# Patient Record
Sex: Male | Born: 2000 | Race: Black or African American | Hispanic: No | Marital: Single | Smoking: Never smoker
Health system: Southern US, Community
[De-identification: ages and names within clinical notes are randomized; demographics above are authoritative.]

## PROBLEM LIST (undated history)

## (undated) DIAGNOSIS — H209 Unspecified iridocyclitis: Secondary | ICD-10-CM

## (undated) DIAGNOSIS — D1621 Benign neoplasm of long bones of right lower limb: Secondary | ICD-10-CM

## (undated) DIAGNOSIS — Z9889 Other specified postprocedural states: Secondary | ICD-10-CM

## (undated) DIAGNOSIS — I861 Scrotal varices: Secondary | ICD-10-CM

## (undated) HISTORY — DX: Scrotal varices: I86.1

## (undated) HISTORY — PX: EYE SURGERY: SHX253

## (undated) HISTORY — DX: Benign neoplasm of long bones of right lower limb: D16.21

## (undated) HISTORY — DX: Unspecified iridocyclitis: H20.9

## (undated) HISTORY — PX: FETAL SURGERY FOR CONGENITAL HERNIA: SHX1618

## (undated) HISTORY — DX: Other specified postprocedural states: Z98.890

## (undated) HISTORY — PX: RUPTURED GLOBE EXPLORATION AND REPAIR: SHX2366

---
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
## 2022-02-16 DIAGNOSIS — S91301A Unspecified open wound, right foot, initial encounter: Secondary | ICD-10-CM | POA: Diagnosis not present

## 2022-02-16 DIAGNOSIS — W450XXA Nail entering through skin, initial encounter: Secondary | ICD-10-CM | POA: Diagnosis not present

## 2023-01-11 DIAGNOSIS — R519 Headache, unspecified: Secondary | ICD-10-CM | POA: Diagnosis not present

## 2023-01-22 ENCOUNTER — Encounter (HOSPITAL_BASED_OUTPATIENT_CLINIC_OR_DEPARTMENT_OTHER): Payer: Self-pay | Admitting: Urology

## 2023-01-22 ENCOUNTER — Emergency Department (HOSPITAL_BASED_OUTPATIENT_CLINIC_OR_DEPARTMENT_OTHER)
Admission: EM | Admit: 2023-01-22 | Discharge: 2023-01-22 | Disposition: A | Discharge status: Home / Self Care | Payer: BC Managed Care – PPO | Attending: Emergency Medicine | Admitting: Emergency Medicine

## 2023-01-22 ENCOUNTER — Other Ambulatory Visit: Payer: Self-pay

## 2023-01-22 DIAGNOSIS — H5711 Ocular pain, right eye: Secondary | ICD-10-CM | POA: Diagnosis not present

## 2023-01-22 MED ORDER — KETOROLAC TROMETHAMINE 60 MG/2ML IM SOLN
15.0000 mg | Freq: Once | INTRAMUSCULAR | Status: AC
  Administered 2023-01-22: 15 mg via INTRAMUSCULAR
  Filled 2023-01-22: qty 2

## 2023-01-22 MED ORDER — ERYTHROMYCIN 5 MG/GM OP OINT
TOPICAL_OINTMENT | Freq: Once | OPHTHALMIC | Status: AC
  Administered 2023-01-22: 1 via OPHTHALMIC
  Filled 2023-01-22: qty 3.5

## 2023-01-22 MED ORDER — TETRACAINE HCL 0.5 % OP SOLN
2.0000 [drp] | Freq: Once | OPHTHALMIC | Status: AC
  Administered 2023-01-22: 2 [drp] via OPHTHALMIC
  Filled 2023-01-22: qty 4

## 2023-01-22 MED ORDER — OXYCODONE HCL 5 MG PO TABS
5.0000 mg | ORAL_TABLET | ORAL | Status: AC
  Administered 2023-01-22: 5 mg via ORAL
  Filled 2023-01-22: qty 1

## 2023-01-22 MED ORDER — FLUORESCEIN SODIUM 1 MG OP STRP
1.0000 | ORAL_STRIP | Freq: Once | OPHTHALMIC | Status: AC
  Administered 2023-01-22: 1 via OPHTHALMIC
  Filled 2023-01-22: qty 1

## 2023-01-22 MED ORDER — OXYCODONE HCL 5 MG PO TABS: 5.0000 mg | ORAL_TABLET | ORAL | 0 refills | Status: DC | PRN

## 2023-01-22 NOTE — ED Triage Notes (Signed)
Pt states right eye pain and redness that started Friday  Denies any known injury  No drainage

## 2023-01-22 NOTE — ED Notes (Signed)
Pt A&OX4 ambulatory at d/c with independent steady gait. Pt and mother verbalized understanding of d/c instructions, prescription and follow up care ?

## 2023-01-22 NOTE — ED Provider Notes (Signed)
  [place] [facility] Provider Note   CSN: [id number] Arrival date & time: 01/22/23  1652     History {Add pertinent medical, surgical, social history, OB history to HPI:1} Chief Complaint  Patient presents with   Eye Pain    Jesus Massey is a 22 y.o. male.   Eye Pain       Home Medications Prior to Admission medications   Medication Sig Start Date End Date Taking? Authorizing Provider  naproxen (NAPROSYN) 375 MG tablet Take 1 tablet twice daily as needed for back pain. 01/11/19   Molpus, John, MD  omeprazole (PRILOSEC) 40 MG capsule Take 40 mg by mouth daily.    [provider]      Allergies    Patient has no known allergies.    Review of Systems   Review of Systems  Eyes:  Positive for pain.    Physical Exam Updated Vital Signs BP 136/80 (BP Location: Left Arm)   Pulse 73   Temp 98.2 F (36.8 C)   Resp 18   Ht 5\' 10"  (1.778 m)   Wt 70.3 kg   SpO2 96%   BMI 22.24 kg/m  Physical Exam  ED Results / Procedures / Treatments   Labs (all labs ordered are listed, but only abnormal results are displayed) Labs Reviewed - No data to display  EKG None  Radiology No results found.  Procedures Procedures  {Document cardiac monitor, telemetry assessment procedure when appropriate:1}  Medications Ordered in ED Medications - No data to display  ED Course/ Medical Decision Making/ A&P   {   Click here for ABCD2, HEART and other calculatorsREFRESH Note before signing :1}                          Medical Decision Making  ***  {Document critical care time when appropriate:1} {Document review of labs and clinical decision tools ie heart score, Chads2Vasc2 etc:1}  {Document your independent review of radiology images, and any outside records:1} {Document your discussion with family members, caretakers, and with consultants:1} {Document social determinants of health affecting pt's care:1} {Document your  decision making why or why not admission, treatments were needed:1} Final Clinical Impression(s) / ED Diagnoses Final diagnoses:  None    Rx / DC Orders ED Discharge Orders     None

## 2023-01-22 NOTE — ED Notes (Signed)
Visual acuity completed per order. Pt wears glasses and reports blurriness in right eye.

## 2023-01-22 NOTE — Discharge Instructions (Addendum)
You were seen in the ER today for evaluation of your right eye pain. I am unsure of what is causing this pain. I have spoken to a physician on call at your eye doctor. They will call you to schedule an appointment for tomorrow. Please make sure you answer your phone. If you haven't heard from them by the afternoon, please call. Please take 1000mg  of Tylenol and 600mg  of Ibuprofen every 6 hours as needed for pain. He already received a dose of ibuprofen here. I will send home a few narcotic pain pills as well. Please do not drive or operate heavy machinery while on this medication. Take only for breakthrough pain. Additionally, you can use the erythromycin ointment every few hours while awake to help with symptoms as well. Please do not rub your eye. If you have any concerns, new or worsening symptoms, please return to the nearest ER for re-evaluation.   Contact a doctor if: You keep having eye pain and other symptoms for more than 2 days. You get new symptoms, such as more redness, watery eyes, or discharge. You have discharge that makes your eyelids stick together in the morning. Your eye patch becomes so loose that you can blink your eye. Symptoms come back after your eye heals. Get help right away if: You have very bad eye pain that does not get better with medicine. You lose eyesight.

## 2023-01-23 DIAGNOSIS — H209 Unspecified iridocyclitis: Secondary | ICD-10-CM | POA: Diagnosis not present

## 2023-01-23 DIAGNOSIS — Z9889 Other specified postprocedural states: Secondary | ICD-10-CM | POA: Diagnosis not present

## 2023-01-30 DIAGNOSIS — H52211 Irregular astigmatism, right eye: Secondary | ICD-10-CM | POA: Diagnosis not present

## 2023-01-30 DIAGNOSIS — Z9889 Other specified postprocedural states: Secondary | ICD-10-CM | POA: Diagnosis not present

## 2023-01-30 DIAGNOSIS — H209 Unspecified iridocyclitis: Secondary | ICD-10-CM | POA: Diagnosis not present

## 2023-01-30 DIAGNOSIS — H179 Unspecified corneal scar and opacity: Secondary | ICD-10-CM | POA: Diagnosis not present

## 2023-02-13 DIAGNOSIS — H5213 Myopia, bilateral: Secondary | ICD-10-CM | POA: Diagnosis not present

## 2023-02-13 DIAGNOSIS — H2011 Chronic iridocyclitis, right eye: Secondary | ICD-10-CM | POA: Diagnosis not present

## 2023-02-13 DIAGNOSIS — Z91148 Patient's other noncompliance with medication regimen for other reason: Secondary | ICD-10-CM | POA: Diagnosis not present

## 2023-02-13 DIAGNOSIS — Z9889 Other specified postprocedural states: Secondary | ICD-10-CM | POA: Diagnosis not present

## 2023-02-13 DIAGNOSIS — H209 Unspecified iridocyclitis: Secondary | ICD-10-CM | POA: Diagnosis not present

## 2023-02-13 DIAGNOSIS — H52203 Unspecified astigmatism, bilateral: Secondary | ICD-10-CM | POA: Diagnosis not present

## 2023-02-13 DIAGNOSIS — H21561 Pupillary abnormality, right eye: Secondary | ICD-10-CM | POA: Diagnosis not present

## 2023-03-29 ENCOUNTER — Emergency Department (HOSPITAL_BASED_OUTPATIENT_CLINIC_OR_DEPARTMENT_OTHER)
Admission: EM | Admit: 2023-03-29 | Discharge: 2023-03-29 | Disposition: A | Discharge status: Home / Self Care | Payer: BC Managed Care – PPO | Attending: Emergency Medicine | Admitting: Emergency Medicine

## 2023-03-29 ENCOUNTER — Encounter (HOSPITAL_BASED_OUTPATIENT_CLINIC_OR_DEPARTMENT_OTHER): Payer: Self-pay | Admitting: Emergency Medicine

## 2023-03-29 ENCOUNTER — Other Ambulatory Visit: Payer: Self-pay

## 2023-03-29 DIAGNOSIS — R519 Headache, unspecified: Secondary | ICD-10-CM | POA: Diagnosis not present

## 2023-03-29 DIAGNOSIS — R42 Dizziness and giddiness: Secondary | ICD-10-CM | POA: Insufficient documentation

## 2023-03-29 LAB — BASIC METABOLIC PANEL
Anion gap: 10 (ref 5–15)
BUN: 15 mg/dL (ref 6–20)
CO2: 24 mmol/L (ref 22–32)
Calcium: 9.2 mg/dL (ref 8.9–10.3)
Chloride: 103 mmol/L (ref 98–111)
Creatinine, Ser: 0.92 mg/dL (ref 0.61–1.24)
GFR, Estimated: >60 mL/min (ref >60)
Glucose, Bld: 101 mg/dL — ABNORMAL HIGH (ref 70–99)
Potassium: 3.9 mmol/L (ref 3.5–5.1)
Sodium: 137 mmol/L (ref 135–145)

## 2023-03-29 LAB — CBC
HCT: 44.5 % (ref 39.0–52.0)
Hemoglobin: 14.4 g/dL (ref 13.0–17.0)
MCH: 27.6 pg (ref 26.0–34.0)
MCHC: 32.4 g/dL (ref 30.0–36.0)
MCV: 85.2 fL (ref 80.0–100.0)
Platelets: 163 10*3/uL (ref 150–400)
RBC: 5.22 MIL/uL (ref 4.22–5.81)
RDW: 14.1 % (ref 11.5–15.5)
WBC: 3.9 10*3/uL — ABNORMAL LOW (ref 4.0–10.5)
nRBC: 0 % (ref 0.0–0.2)

## 2023-03-29 LAB — CBG MONITORING, ED: Glucose-Capillary: 92 mg/dL (ref 70–99)

## 2023-03-29 LAB — URINALYSIS, ROUTINE W REFLEX MICROSCOPIC
Bilirubin Urine: NEGATIVE
Glucose, UA: NEGATIVE mg/dL
Hgb urine dipstick: NEGATIVE
Ketones, ur: NEGATIVE mg/dL
Leukocytes,Ua: NEGATIVE
Nitrite: NEGATIVE
Protein, ur: NEGATIVE mg/dL
Specific Gravity, Urine: 1.02 (ref 1.005–1.030)
pH: 7 (ref 5.0–8.0)

## 2023-03-29 MED ORDER — PROCHLORPERAZINE EDISYLATE 10 MG/2ML IJ SOLN
10.0000 mg | Freq: Once | INTRAMUSCULAR | Status: AC
  Administered 2023-03-29: 10 mg via INTRAVENOUS
  Filled 2023-03-29: qty 2

## 2023-03-29 MED ORDER — KETOROLAC TROMETHAMINE 15 MG/ML IJ SOLN
15.0000 mg | Freq: Once | INTRAMUSCULAR | Status: AC
  Administered 2023-03-29: 15 mg via INTRAVENOUS
  Filled 2023-03-29: qty 1

## 2023-03-29 MED ORDER — DIPHENHYDRAMINE HCL 50 MG/ML IJ SOLN
12.5000 mg | Freq: Once | INTRAMUSCULAR | Status: AC
  Administered 2023-03-29: 12.5 mg via INTRAVENOUS
  Filled 2023-03-29: qty 1

## 2023-03-29 MED ORDER — MECLIZINE HCL 12.5 MG PO TABS: 25.0000 mg | ORAL_TABLET | Freq: Three times a day (TID) | ORAL | 0 refills | Status: AC | PRN

## 2023-03-29 MED ORDER — MAGNESIUM SULFATE 2 GM/50ML IV SOLN
2.0000 g | Freq: Once | INTRAVENOUS | Status: AC
  Administered 2023-03-29: 2 g via INTRAVENOUS
  Filled 2023-03-29: qty 50

## 2023-03-29 MED ORDER — SODIUM CHLORIDE 0.9 % IV BOLUS
1000.0000 mL | Freq: Once | INTRAVENOUS | Status: AC
Start: 2023-03-29 — End: 2023-03-29
  Administered 2023-03-29: 1000 mL via INTRAVENOUS

## 2023-03-29 NOTE — ED Provider Notes (Signed)
[facility] Provider Note   CSN: [id number] Arrival date & time: 03/29/23  1720     History  Chief Complaint  Patient presents with   Dizziness    Jesus Massey is a 22 y.o. male with no significant past medical history presents the ED today for dizziness.  Patient reports he has been experiencing intermittent dizziness for the past 4 days with a persistent headache.  The dizziness is worse when moving his head.  Patient reports a history of dizziness in the past but it was self-limited. Denies recent head injury, illness, or changes in hearing. No other complaints or concerns at this time.    Home Medications Prior to Admission medications   Medication Sig Start Date End Date Taking? Authorizing Provider  naproxen (NAPROSYN) 375 MG tablet Take 1 tablet twice daily as needed for back pain. 01/11/19   Molpus, John, MD  omeprazole (PRILOSEC) 40 MG capsule Take 40 mg by mouth daily.    [provider]  oxyCODONE (ROXICODONE) 5 MG immediate release tablet Take 1 tablet (5 mg total) by mouth every 4 (four) hours as needed for severe pain. 01/22/23   Achille Rich, PA-C      Allergies    Patient has no known allergies.    Review of Systems   Review of Systems  Neurological:  Positive for dizziness.  All other systems reviewed and are negative.   Physical Exam Updated Vital Signs BP 113/65   Pulse 60   Temp 98.1 F (36.7 C) (Oral)   Resp 18   Ht 5\' 10"  (1.778 m)   Wt 70.3 kg   SpO2 100%   BMI 22.24 kg/m  Physical Exam Vitals and nursing note reviewed.  Constitutional:      General: He is not in acute distress.    Appearance: Normal appearance.  HENT:     Head: Normocephalic and atraumatic.     Mouth/Throat:     Mouth: Mucous membranes are moist.  Eyes:     Extraocular Movements: Extraocular movements intact.     Conjunctiva/sclera: Conjunctivae normal.     Pupils: Pupils are equal, round, and reactive to light.      Comments: No nystagmus appreciated.  Cardiovascular:     Rate and Rhythm: Normal rate and regular rhythm.     Pulses: Normal pulses.     Heart sounds: Normal heart sounds.  Pulmonary:     Effort: Pulmonary effort is normal.     Breath sounds: Normal breath sounds.  Abdominal:     Palpations: Abdomen is soft.     Tenderness: There is no abdominal tenderness.  Musculoskeletal:        General: No tenderness. Normal range of motion.     Cervical back: Normal range of motion.  Skin:    General: Skin is warm and dry.     Findings: No rash.  Neurological:     General: No focal deficit present.     Mental Status: He is alert.     Sensory: No sensory deficit.     Motor: No weakness.  Psychiatric:        Mood and Affect: Mood normal.        Behavior: Behavior normal.    Orthostatic Lying BP- Lying: 119/69 Pulse- Lying: 58 Orthostatic Sitting BP- Sitting: 127/73 Pulse- Sitting: 60 Orthostatic Standing at 0 minutes BP- Standing at 0 minutes: 117/71 Pulse- Standing at 0 minutes: 65 Orthostatic Standing at 3 minutes BP- Standing  at 3 minutes: 127/83 Pulse- Standing at 3 minutes: 64 ED Results / Procedures / Treatments   Labs (all labs ordered are listed, but only abnormal results are displayed) Labs Reviewed  BASIC METABOLIC PANEL - Abnormal; Notable for the following components:      Result Value   Glucose, Bld 101 (*)    All other components within normal limits  CBC - Abnormal; Notable for the following components:   WBC 3.9 (*)    All other components within normal limits  URINALYSIS, ROUTINE W REFLEX MICROSCOPIC  CBG MONITORING, ED    EKG None  Radiology No results found.  Procedures Procedures: not indicated.   Medications Ordered in ED Medications  ketorolac (TORADOL) 15 MG/ML injection 15 mg (15 mg Intravenous Given 03/29/23 2017)  diphenhydrAMINE (BENADRYL) injection 12.5 mg (12.5 mg Intravenous Given 03/29/23 2016)  prochlorperazine (COMPAZINE)  injection 10 mg (10 mg Intravenous Given 03/29/23 2017)  sodium chloride 0.9 % bolus 1,000 mL (1,000 mLs Intravenous New Bag/Given 03/29/23 2016)  magnesium sulfate IVPB 2 g 50 mL (2 g Intravenous New Bag/Given 03/29/23 2020)    ED Course/ Medical Decision Making/ A&P                             Medical Decision Making Amount and/or Complexity of Data Reviewed Labs: ordered.  Risk Prescription drug management.   This patient presents to the ED for concern of dizziness, this involves an extensive number of treatment options, and is a complaint that carries with it a high risk of complications and morbidity.   Differential diagnosis includes: BPPV, labyrinthitis, Mnire's disease, vestibular neuritis, hypoglycemia, anemia, electrolyte abnormality, etc.   Co morbidities that complicate the patient evaluation  History of dizziness   Additional history obtained:  Additional history obtained from patient's records and mother at bedside.   Lab Tests:  I ordered and personally interpreted labs.  The pertinent results include:   BMP, CBC, CBG, and UA are within normal limits - no acute electrolyte abnormality, anemia, hypo or hyperglycemia, or urinary infection.   Problem List / ED Course / Critical interventions / Medication management  Dizziness and headache I ordered medications including: Ketorolac, Benadryl, Compazine, magnesium, and fluids for headache  Reevaluation of the patient after these medicines showed that the patient improved I have reviewed the patients home medicines and have made adjustments as needed Orthostatic vital signs are negative for orthostatic hypotension.   Social Determinants of Health:  Access to healthcare   Test / Admission - Considered:  Discussed results with patient and mother at bedside.  Prescription for Meclizine sent to the pharmacy. Patient stable and safe for discharge home.  Return precautions provided.       Final  Clinical Impression(s) / ED Diagnoses Final diagnoses:  None    Rx / DC Orders ED Discharge Orders     None         Maxwell Marion, PA-C 03/29/23 2145    Arby Barrette, MD 04/06/23 1050

## 2023-03-29 NOTE — Discharge Instructions (Addendum)
As discussed, your symptoms are most likely due to positional vertigo.  You can take 25 mg of Meclizine as needed for dizziness, your insurance does not cover the 25 mg tablets so take 2 of the 12.5 tablets.  I have attached information on how to perform the Epley maneuver, the movements you can do to help realign the crystals over your ear and prevent dizziness.  A referral has been sent to primary care provider.  You can also use the phone number in the discharge paperwork to help get established with a local provider.  Get help right away if: You vomit or have diarrhea and are unable to eat or drink anything. You have problems talking, walking, swallowing, or using your arms, hands, or legs. You feel generally weak. You have any bleeding. You are not thinking clearly or you have trouble forming sentences. It may take a friend or family member to notice this. You have chest pain, abdominal pain, shortness of breath, or sweating. Your vision changes or you develop a severe headache.

## 2023-03-29 NOTE — ED Triage Notes (Signed)
Pt reports dizziness, HA and nausea x 4d; denies ShoB, CP, vision changes, or other sxs

## 2023-04-19 ENCOUNTER — Ambulatory Visit (INDEPENDENT_AMBULATORY_CARE_PROVIDER_SITE_OTHER): Payer: BC Managed Care – PPO | Admitting: Family Medicine

## 2023-04-19 VITALS — BP 113/54 | HR 62 | Temp 97.8°F | Resp 16 | Ht 70.0 in | Wt 173.2 lb

## 2023-04-19 DIAGNOSIS — Z9889 Other specified postprocedural states: Secondary | ICD-10-CM | POA: Insufficient documentation

## 2023-04-19 DIAGNOSIS — H2011 Chronic iridocyclitis, right eye: Secondary | ICD-10-CM | POA: Insufficient documentation

## 2023-04-19 DIAGNOSIS — Z0000 Encounter for general adult medical examination without abnormal findings: Secondary | ICD-10-CM | POA: Diagnosis not present

## 2023-04-19 DIAGNOSIS — Z7689 Persons encountering health services in other specified circumstances: Secondary | ICD-10-CM

## 2023-04-19 NOTE — Progress Notes (Signed)
Complete physical exam  Patient: Jesus Massey   DOB: [birth year]   22 y.o. Male  MRN: [id number]  Subjective:    Chief Complaint  Patient presents with   Establish Care    Denies any concerns     Jesus Massey is a 22 y.o. male who presents today to establish care and for a complete physical exam.    Discussed the use of AI scribe software for clinical note transcription with the patient, who gave verbal consent to proceed.  History of Present Illness   The patient, a college sophomore, presents for a primary care consultation. 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 Currently, they report good vision and are able to participate in sports and wear contact lenses.  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 They report no current issues related to this condition.  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 They deny any current health issues, including trouble sleeping, and report no dietary restrictions. They also deny any feelings of depression or hopelessness.  They recently visited the emergency room for dizziness, which was diagnosed as vertigo and treated with medication. The patient reports that the dizziness has since resolved.         He reports consuming a general diet.  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 He reports sleeping well. He does not have additional problems to discuss today.   Currently lives with: mom Acute concerns or interim problems since last visit: no  Vision concerns: no acute concerns, follows with eye doctor annually Dental concerns: no concerns STD concerns: no concerns   Patient  denies ETOH use. Patient denies nicotine use. Patient denies illegal substance use.        Most recent fall risk assessment:    04/19/2023    2:11 PM  Fall Risk   Falls in the past year? 0  Number falls in past yr: 0  Injury with Fall? 0  Follow up Falls evaluation completed     Most recent depression screenings:    04/19/2023    2:11 PM  PHQ 2/9 Scores  PHQ - 2 Score 0            Patient Care Team: Jesus Dana, NP as PCP - General (Family Medicine)   Outpatient Medications Prior to Visit  Medication Sig   [DISCONTINUED] naproxen (NAPROSYN) 375 MG tablet Take 1 tablet twice daily as needed for back pain. (Patient not taking: Reported on 04/19/2023)   [DISCONTINUED] omeprazole (PRILOSEC) 40 MG capsule Take 40 mg by mouth daily. (Patient not taking: Reported on 04/19/2023)   [DISCONTINUED] oxyCODONE (ROXICODONE) 5 MG immediate release tablet Take 1 tablet (5 mg total) by mouth every 4 (four) hours as needed for severe pain. (Patient not taking: Reported on 04/19/2023)   No facility-administered medications prior to visit.    ROS All review of systems negative except what is listed in the HPI        Objective:     BP (!) 113/54   Pulse 62   Temp 97.8 F (36.6 C) (Oral)   Resp 16   Ht 5\' 10"  (1.778 m)   Wt 173 lb 4  oz (78.6 kg)   SpO2 100%   BMI 24.86 kg/m    Physical Exam Vitals reviewed.  Constitutional:      General: He is not in acute distress.    Appearance: Normal appearance. He is not ill-appearing.  HENT:     Head: Normocephalic and atraumatic.     Right Ear: Tympanic membrane normal.     Left Ear: Tympanic membrane normal.     Nose: Nose normal.     Mouth/Throat:     Mouth: Mucous membranes are moist.     Pharynx: Oropharynx is clear.  Eyes:     Extraocular Movements: Extraocular movements intact.     Conjunctiva/sclera: Conjunctivae normal.     Pupils: Pupils are equal, round, and reactive to light.  Neck:     Vascular: No carotid  bruit.  Cardiovascular:     Rate and Rhythm: Normal rate and regular rhythm.     Pulses: Normal pulses.     Heart sounds: Normal heart sounds.  Pulmonary:     Effort: Pulmonary effort is normal.     Breath sounds: Normal breath sounds.  Abdominal:     General: Abdomen is flat. Bowel sounds are normal. There is no distension.     Palpations: Abdomen is soft. There is no mass.     Tenderness: There is no abdominal tenderness. There is no right CVA tenderness, left CVA tenderness, guarding or rebound.  Genitourinary:    Comments: Deferred exam Musculoskeletal:        General: Normal range of motion.     Cervical back: Normal range of motion and neck supple. No tenderness.     Right lower leg: No edema.     Left lower leg: No edema.  Lymphadenopathy:     Cervical: No cervical adenopathy.  Skin:    General: Skin is warm and dry.     Capillary Refill: Capillary refill takes less than 2 seconds.  Neurological:     General: No focal deficit present.     Mental Status: He is alert and oriented to person, place, and time. Mental status is at baseline.  Psychiatric:        Mood and Affect: Mood normal.        Behavior: Behavior normal.        Thought Content: Thought content normal.        Judgment: Judgment normal.         No results found for any visits on 04/19/23.     Assessment & Plan:    Routine Health Maintenance and Physical Exam Discussed health promotion and safety including diet and exercise recommendations, dental health, and injury prevention. Tobacco cessation if applicable. Seat belts, sunscreen, smoke detectors, etc.    Immunization History  Administered Date(s) Administered   DTaP 08/17/2003   Hepatitis A, Ped/Adol-2 Dose 07/08/2018, 07/08/2019   Hepatitis B, PED/ADOLESCENT 07/28/2003   Hpv-Unspecified 07/02/2019   IPV 08/17/2003   Meningococcal B, OMV 07/02/2019, 08/24/2020   Meningococcal polysaccharide vaccine (MPSV4) 03/04/2015, 07/02/2019    Pneumococcal Conjugate-13 08/17/2003   Tdap 03/04/2015    Health Maintenance  Topic Date Due   HIV Screening  Never done   HPV VACCINES (2 - Male 3-dose series) 07/30/2019   Hepatitis C Screening  Never done   COVID-19 Vaccine (1 - 2023-24 season) Never done   INFLUENZA VACCINE  04/19/2023   DTaP/Tdap/Td (3 - Td or Tdap) 03/03/2025        Problem List Items Addressed This Visit  None Visit Diagnoses     Annual physical exam    -  Primary Health promotion and safety discussed We have requested vaccine records from pediatrician  Deferred labs today     Encounter to establish care          Return in about 1 year (around 04/18/2024) for physical.   Jesus Dana, NP

## 2023-04-19 NOTE — Patient Instructions (Signed)
Thank you for choosing [facility] for your Primary [facility] needs. I am excited for the opportunity to partner with you to meet your health Massey goals. It was a pleasure meeting you today!  Information on diet, exercise, and health maintenance recommendations are listed below. This is information to help you be sure you are on track for optimal health and monitoring.   Please look over this and let us know if you have any questions or if you have completed any of the health maintenance outside of [place] so that we can be sure your records are up to date.  ___________________________________________________________  MyChart:  For all urgent or time sensitive needs we ask that you please call the office to avoid delays. Our number is [phone]. MyChart is not constantly monitored and due to the large volume of messages a day, replies may take up to 72 business hours.  MyChart Policy: MyChart allows for you to see your visit notes, after visit summary, provider recommendations, lab and tests results, make an appointment, request refills, and contact your provider or the office for non-urgent questions or concerns. Providers are seeing patients during normal business hours and do not have built in time to review MyChart messages.  We ask that you allow a minimum of 3 business days for responses to MyChart messages. For this reason, please do not send urgent requests through MyChart. Please call the office at [phone]. New and ongoing conditions may require a visit. We have virtual and in-person visits available for your convenience.  Complex MyChart concerns may require a visit. Your provider may request you schedule a virtual or in-person visit to ensure we are providing the best Massey possible. MyChart messages sent after 11:00 AM on Friday will not be received by the provider until Monday morning.    Lab and Test Results: You will receive your lab and test  results on MyChart as soon as they are completed and results have been sent by the lab or testing facility. Due to this service, you will receive your results BEFORE your provider.  I review lab and test results each morning prior to seeing patients. Some results require collaboration with other providers to ensure you are receiving the most appropriate Massey. For this reason, we ask that you please allow a minimum of 3-5 business days from the time that ALL results have been received for your provider to receive and review lab and test results and contact you about these.  Most lab and test result comments from the provider will be sent through MyChart. Your provider may recommend changes to the plan of Massey, follow-up visits, repeat testing, ask questions, or request an office visit to discuss these results. You may reply directly to this message or call the office to provide information for the provider or set up an appointment. In some instances, you will be called with test results and recommendations. Please let us know if this is preferred and we will make note of this in your chart to provide this for you.    If you have not heard a response to your lab or test results in 5 business days from all results returning to MyChart, please call the office to let us know. We ask that you please avoid calling prior to this time unless there is an emergent concern. Due to high call volumes, this can delay the resulting process.  After Hours: For all non-emergency after hours needs, please   call the office at [phone] and select the option to reach the on-call  service. On-call services are shared between multiple [facility] offices and therefore it will not be possible to speak directly with your provider. On-call providers may provide medical advice and recommendations, but are unable to provide refills for maintenance medications.  For all emergency or urgent medical needs after normal business hours, we  recommend that you seek Massey at the closest Urgent Massey or Emergency Department to ensure appropriate treatment in a timely manner.  [facility] has a 24 hour emergency room located on the ground floor for your convenience.   Urgent Concerns During the Business Day Providers are seeing patients from 8AM to 5PM with a busy schedule and are most often not able to respond to non-urgent calls until the end of the day or the next business day. If you should have URGENT concerns during the day, please call and speak to the nurse or schedule a same day appointment so that we can address your concern without delay.   Thank you, again, for choosing me as your health Massey partner. I appreciate your trust and look forward to learning more about you!   Taylor B. Beck, DNP, FNP-C  ___________________________________________________________  Health Maintenance Recommendations Screening Testing Mammogram Every 1-2 years based on history and risk factors Starting at age 50 Pap Smear Ages 21-39 every 3 years Ages 30-65 every 5 years with HPV testing More frequent testing may be required based on results and history Colon Cancer Screening Every 1-10 years based on test performed, risk factors, and history Starting at age 45 Bone Density Screening Every 2-10 years based on history Starting at age 65 for women Recommendations for men differ based on medication usage, history, and risk factors AAA Screening One time ultrasound Men 65-75 years old who have ever smoked Lung Cancer Screening Low Dose Lung CT every 12 months Age 50-80 years with a 20 pack-year smoking history who still smoke or who have quit within the last 15 years  Screening Labs Routine  Labs: Complete Blood Count (CBC), Complete Metabolic Panel (CMP), Cholesterol (Lipid Panel) Every 6-12 months based on history and medications May be recommended more frequently based on current conditions or previous results Hemoglobin  A1c Lab Every 3-12 months based on history and previous results Starting at age 45 or earlier with diagnosis of diabetes, high cholesterol, BMI >26, and/or risk factors Frequent monitoring for patients with diabetes to ensure blood sugar control Thyroid Panel  Every 6 months based on history, symptoms, and risk factors May be repeated more often if on medication HIV One time testing for all patients 13 and older May be repeated more frequently for patients with increased risk factors or exposure Hepatitis C One time testing for all patients 18 and older May be repeated more frequently for patients with increased risk factors or exposure Gonorrhea, Chlamydia Every 12 months for all sexually active persons 13-24 years Additional monitoring may be recommended for those who are considered high risk or who have symptoms PSA Men 40-54 years old with risk factors Additional screening may be recommended from age 55-69 based on risk factors, symptoms, and history  Vaccine Recommendations Tetanus Booster All adults every 10 years Flu Vaccine All patients 6 months and older every year COVID Vaccine All patients 12 years and older Initial dosing with booster May recommend additional booster based on age and health history HPV Vaccine 2 doses all patients age 9-26 Dosing may be considered   for patients over 26 Shingles Vaccine (Shingrix) 2 doses all adults 50 years and older Pneumonia (Pneumovax 23) All adults 65 years and older May recommend earlier dosing based on health history Pneumonia (Prevnar 13) All adults 65 years and older Dosed 1 year after Pneumovax 23 Pneumonia (Prevnar 20) All adults 65 years and older (adults 19-64 with certain conditions or risk factors) 1 dose  For those who have not received Prevnar 13 vaccine previously   Additional Screening, Testing, and Vaccinations may be recommended on an individualized basis based on family history, health history, risk  factors, and/or exposure.  __________________________________________________________  Diet Recommendations for All Patients  I recommend that all patients maintain a diet low in saturated fats, carbohydrates, and cholesterol. While this can be challenging at first, it is not impossible and small changes can make big differences.  Things to try: Decreasing the amount of soda, sweet tea, and/or juice to one or less per day and replace with water While water is always the first choice, if you do not like water you may consider adding a water additive without sugar to improve the taste other sugar free drinks Replace potatoes with a brightly colored vegetable  Use healthy oils, such as canola oil or olive oil, instead of butter or hard margarine Limit your bread intake to two pieces or less a day Replace regular pasta with low carb pasta options Bake, broil, or grill foods instead of frying Monitor portion sizes  Eat smaller, more frequent meals throughout the day instead of large meals  An important thing to remember is, if you love foods that are not great for your health, you don't have to give them up completely. Instead, allow these foods to be a reward when you have done well. Allowing yourself to still have special treats every once in a while is a nice way to tell yourself thank you for working hard to keep yourself healthy.   Also remember that every day is a new day. If you have a bad day and "fall off the wagon", you can still climb right back up and keep moving along on your journey!  We have resources available to help you!  Some websites that may be helpful include: www.MyPlate.gov  Www.VeryWellFit.com _____________________________________________________________  Activity Recommendations for All Patients  I recommend that all adults get at least 20 minutes of moderate physical activity that elevates your heart rate at least 5 days out of the week.  Some examples  include: Walking or jogging at a pace that allows you to carry on a conversation Cycling (stationary bike or outdoors) Water aerobics Yoga Weight lifting Dancing If physical limitations prevent you from putting stress on your joints, exercise in a pool or seated in a chair are excellent options.  Do determine your MAXIMUM heart rate for activity: 220 - YOUR AGE = MAX Heart Rate   Remember! Do not push yourself too hard.  Start slowly and build up your pace, speed, weight, time in exercise, etc.  Allow your body to rest between exercise and get good sleep. You will need more water than normal when you are exerting yourself. Do not wait until you are thirsty to drink. Drink with a purpose of getting in at least 8, 8 ounce glasses of water a day plus more depending on how much you exercise and sweat.    If you begin to develop dizziness, chest pain, abdominal pain, jaw pain, shortness of breath, headache, vision changes, lightheadedness, or other concerning symptoms,   stop the activity and allow your body to rest. If your symptoms are severe, seek emergency evaluation immediately. If your symptoms are concerning, but not severe, please let us know so that we can recommend further evaluation.     

## 2024-10-21 ENCOUNTER — Encounter (HOSPITAL_COMMUNITY): Payer: Self-pay

## 2024-10-21 ENCOUNTER — Other Ambulatory Visit: Payer: Self-pay

## 2024-10-21 ENCOUNTER — Emergency Department (HOSPITAL_COMMUNITY)

## 2024-10-21 ENCOUNTER — Emergency Department (HOSPITAL_COMMUNITY): Admission: EM | Admit: 2024-10-21 | Discharge: 2024-10-21 | Disposition: A | Discharge status: Home / Self Care

## 2024-10-21 DIAGNOSIS — R202 Paresthesia of skin: Secondary | ICD-10-CM | POA: Insufficient documentation

## 2024-10-21 DIAGNOSIS — F17200 Nicotine dependence, unspecified, uncomplicated: Secondary | ICD-10-CM | POA: Insufficient documentation

## 2024-10-21 DIAGNOSIS — R0602 Shortness of breath: Secondary | ICD-10-CM | POA: Insufficient documentation

## 2024-10-21 DIAGNOSIS — R0789 Other chest pain: Secondary | ICD-10-CM | POA: Insufficient documentation

## 2024-10-21 LAB — BASIC METABOLIC PANEL WITH GFR
Anion gap: 10 (ref 5–15)
BUN: 17 mg/dL (ref 6–20)
CO2: 24 mmol/L (ref 22–32)
Calcium: 9.6 mg/dL (ref 8.9–10.3)
Chloride: 103 mmol/L (ref 98–111)
Creatinine, Ser: 0.84 mg/dL (ref 0.61–1.24)
GFR, Estimated: >60 mL/min
Glucose, Bld: 105 mg/dL — ABNORMAL HIGH (ref 70–99)
Potassium: 3.7 mmol/L (ref 3.5–5.1)
Sodium: 138 mmol/L (ref 135–145)

## 2024-10-21 LAB — CBC
HCT: 42.5 % (ref 39.0–52.0)
Hemoglobin: 13.9 g/dL (ref 13.0–17.0)
MCH: 27.6 pg (ref 26.0–34.0)
MCHC: 32.7 g/dL (ref 30.0–36.0)
MCV: 84.5 fL (ref 80.0–100.0)
Platelets: 177 10*3/uL (ref 150–400)
RBC: 5.03 MIL/uL (ref 4.22–5.81)
RDW: 13.8 % (ref 11.5–15.5)
WBC: 3.9 10*3/uL — ABNORMAL LOW (ref 4.0–10.5)
nRBC: 0 % (ref 0.0–0.2)

## 2024-10-21 LAB — MAGNESIUM: Magnesium: 2.1 mg/dL (ref 1.7–2.4)

## 2024-10-21 LAB — TROPONIN T, HIGH SENSITIVITY
Troponin T High Sensitivity: <6 ng/L (ref 0–19)
Troponin T High Sensitivity: <6 ng/L (ref 0–19)

## 2024-10-21 LAB — D-DIMER, QUANTITATIVE: D-Dimer, Quant: 0.27 ug{FEU}/mL (ref 0.00–0.50)

## 2024-10-21 MED ORDER — HYDROXYZINE HCL 25 MG PO TABS
25.0000 mg | ORAL_TABLET | Freq: Once | ORAL | Status: AC
  Administered 2024-10-21: 25 mg via ORAL
  Filled 2024-10-21: qty 1

## 2024-10-21 MED ORDER — HYDROXYZINE HCL 25 MG PO TABS: 25.0000 mg | ORAL_TABLET | Freq: Three times a day (TID) | ORAL | 0 refills | Status: AC | PRN

## 2024-10-21 NOTE — ED Triage Notes (Signed)
 Pt coming in reporting chest pain and tightening in his chest. Pt reporting shortness of breath but is able to speak in full sentences at triage. Pt reports that his limbs have been going numb and tightening up

## 2024-10-21 NOTE — ED Notes (Signed)
 Pt leaving for home with paper work in had in no new onset distress.Pt is leaving with sister from lobby. Paper work reviewed no questions at this time.

## 2024-10-29 ENCOUNTER — Ambulatory Visit: Admitting: Family Medicine

## 2024-11-05 ENCOUNTER — Encounter: Admitting: Family Medicine
